# Patient Record
Sex: Male | Born: 1965 | Hispanic: No | Marital: Single | Smoking: Current every day smoker
Health system: Southern US, Community
[De-identification: ages and names within clinical notes are randomized; demographics above are authoritative.]

---

## 2019-03-28 ENCOUNTER — Encounter: Payer: Self-pay | Admitting: Internal Medicine

## 2019-05-02 ENCOUNTER — Ambulatory Visit: Payer: BC Managed Care – PPO | Admitting: Nurse Practitioner

## 2019-06-20 ENCOUNTER — Other Ambulatory Visit (HOSPITAL_COMMUNITY): Payer: Self-pay | Admitting: Family Medicine

## 2019-06-20 ENCOUNTER — Other Ambulatory Visit: Payer: Self-pay | Admitting: Family Medicine

## 2019-06-21 ENCOUNTER — Other Ambulatory Visit (HOSPITAL_COMMUNITY): Payer: Self-pay | Admitting: Family Medicine

## 2019-06-21 ENCOUNTER — Other Ambulatory Visit: Payer: Self-pay | Admitting: Family Medicine

## 2019-06-21 DIAGNOSIS — K439 Ventral hernia without obstruction or gangrene: Secondary | ICD-10-CM

## 2019-08-14 ENCOUNTER — Ambulatory Visit: Payer: BC Managed Care – PPO | Admitting: Gastroenterology

## 2019-09-21 ENCOUNTER — Ambulatory Visit
Admission: EM | Admit: 2019-09-21 | Discharge: 2019-09-21 | Disposition: A | Discharge status: Home / Self Care | Payer: BC Managed Care – PPO | Attending: Emergency Medicine | Admitting: Emergency Medicine

## 2019-09-21 ENCOUNTER — Other Ambulatory Visit: Payer: Self-pay

## 2019-09-21 DIAGNOSIS — K047 Periapical abscess without sinus: Secondary | ICD-10-CM | POA: Diagnosis not present

## 2019-09-21 DIAGNOSIS — K029 Dental caries, unspecified: Secondary | ICD-10-CM | POA: Diagnosis not present

## 2019-09-21 MED ORDER — AMOXICILLIN-POT CLAVULANATE 875-125 MG PO TABS: 1.0000 | ORAL_TABLET | Freq: Two times a day (BID) | ORAL | 0 refills | Status: DC

## 2019-09-21 MED ORDER — IBUPROFEN 800 MG PO TABS: 800.0000 mg | ORAL_TABLET | Freq: Three times a day (TID) | ORAL | 0 refills | Status: DC

## 2019-09-21 NOTE — ED Provider Notes (Signed)
[facility]    CSN: [id number] Arrival date & time: 09/21/19  0907      History   Chief Complaint Chief Complaint  Patient presents with  . Dental Pain    HPI Kerry Schroeder is a 54 y.o. male.   Kerry Schroeder is a 54 y.o. male who presents with dental pain for the past 1 week.  Denies a precipitating event or trauma.  Localizes pain to left lower first molar.  Has tried OTC analgesics without relief.  Worse with chewing.  Does not see a dentist regularly.  Reports  similar symptoms in the past.  Denies fever, chills, dysphagia, odynophagia, oral or neck swelling, nausea, vomiting, chest pain, SOB.        The history is provided by the patient. A language interpreter was used.  Dental Pain   History reviewed. No pertinent past medical history.  There are no problems to display for this patient.   History reviewed. No pertinent surgical history.     Home Medications    Prior to Admission medications   Medication Sig Start Date End Date Taking? Authorizing Provider  amoxicillin-clavulanate (AUGMENTIN) 875-125 MG tablet Take 1 tablet by mouth every 12 (twelve) hours. 09/21/19   Cruz Bong, Zachery Dakins, FNP  ibuprofen (ADVIL) 800 MG tablet Take 1 tablet (800 mg total) by mouth 3 (three) times daily. 09/21/19   AvegnoZachery Dakins, FNP    Family History Family History  Problem Relation Age of Onset  . Healthy Mother   . Healthy Father     Social History Social History   Tobacco Use  . Smoking status: Current Every Day Smoker    Packs/day: 0.50  . Smokeless tobacco: Never Used  Substance Use Topics  . Alcohol use: Not Currently  . Drug use: Not on file     Allergies   Patient has no known allergies.   Review of Systems Review of Systems  Constitutional: Negative.   HENT: Positive for dental problem.   Respiratory: Negative.   Cardiovascular: Negative.   Gastrointestinal: Negative.      Physical Exam Triage Vital Signs ED Triage Vitals    Enc Vitals Group     BP      Pulse      Resp      Temp      Temp src      SpO2      Weight      Height      Head Circumference      Peak Flow      Pain Score      Pain Loc      Pain Edu?      Excl. in GC?    No data found.  Updated Vital Signs BP 140/87 (BP Location: Right Arm)   Pulse 90   Temp (!) 97.4 F (36.3 C) (Oral)   Resp 18   SpO2 95%   Visual Acuity Right Eye Distance:   Left Eye Distance:   Bilateral Distance:    Right Eye Near:   Left Eye Near:    Bilateral Near:     Physical Exam Constitutional:      General: He is not in acute distress.    Appearance: Normal appearance. He is normal weight. He is not ill-appearing or toxic-appearing.  HENT:     Mouth/Throat:     Lips: Pink. No lesions.     Mouth: Mucous membranes are moist. No injury or oral lesions.  Dentition: Dental tenderness, dental caries and dental abscesses present. No gingival swelling.   Cardiovascular:     Rate and Rhythm: Normal rate and regular rhythm.     Pulses: Normal pulses.     Heart sounds: Normal heart sounds.  Pulmonary:     Effort: No respiratory distress.     Breath sounds: Normal breath sounds. No wheezing.  Chest:     Chest wall: No tenderness.  Neurological:     Mental Status: He is alert.      UC Treatments / Results  Labs (all labs ordered are listed, but only abnormal results are displayed) Labs Reviewed - No data to display  EKG   Radiology No results found.  Procedures Procedures (including critical care time)  Medications Ordered in UC Medications - No data to display  Initial Impression / Assessment and Plan / UC Course  I have reviewed the triage vital signs and the nursing notes.  Pertinent labs & imaging results that were available during my care of the patient were reviewed by me and considered in my medical decision making (see chart for details).   Patient stable for discharge.  Advised patient to take antibiotic as prescribed  and to completion.  Take ibuprofen as needed for pain.  To follow-up with your dentist as soon as possible.  Patient verbalized understanding of the plan of care.    Final Clinical Impressions(s) / UC Diagnoses   Final diagnoses:  Dental caries  Dental abscess     Discharge Instructions     Ibuprofen and Augmentin were prescribed prescribed.  Use as directed  Recommend soft diet until evaluated by dentist Maintain oral hygiene care Follow up with dentist as soon as possible for further evaluation and treatment  Return or go to the ED if you have any new or worsening symptoms such as fever, chills, difficulty swallowing, painful swallowing, oral or neck swelling, nausea, vomiting, chest pain, SOB, etc...     ED Prescriptions    Medication Sig Dispense Auth. Provider   amoxicillin-clavulanate (AUGMENTIN) 875-125 MG tablet Take 1 tablet by mouth every 12 (twelve) hours. 14 tablet Alanzo Lamb S, FNP   ibuprofen (ADVIL) 800 MG tablet Take 1 tablet (800 mg total) by mouth 3 (three) times daily. 21 tablet Lygia Olaes, Darrelyn Hillock, FNP     PDMP not reviewed this encounter.   Emerson Monte, [place] 09/21/19 [phone]

## 2019-09-21 NOTE — ED Triage Notes (Signed)
Pt presents to UC w/ c/o left lower dental pain x1 week. Pt states he has hole in tooth and the pain gets better when he drinks water, but the pain is coming back more frequently.

## 2019-09-21 NOTE — Discharge Instructions (Addendum)
Ibuprofen and Augmentin were prescribed prescribed.  Use as directed  Recommend soft diet until evaluated by dentist Maintain oral hygiene care Follow up with dentist as soon as possible for further evaluation and treatment  Return or go to the ED if you have any new or worsening symptoms such as fever, chills, difficulty swallowing, painful swallowing, oral or neck swelling, nausea, vomiting, chest pain, SOB, etc..Marland Kitchen

## 2020-03-04 ENCOUNTER — Other Ambulatory Visit: Payer: Self-pay | Admitting: General Surgery

## 2020-03-04 DIAGNOSIS — K439 Ventral hernia without obstruction or gangrene: Secondary | ICD-10-CM

## 2020-03-19 ENCOUNTER — Ambulatory Visit
Admission: RE | Admit: 2020-03-19 | Discharge: 2020-03-19 | Disposition: A | Discharge status: Home / Self Care | Payer: BC Managed Care – PPO | Source: Ambulatory Visit | Attending: General Surgery | Admitting: General Surgery

## 2020-03-19 DIAGNOSIS — K439 Ventral hernia without obstruction or gangrene: Secondary | ICD-10-CM

## 2020-03-19 MED ORDER — IOPAMIDOL (ISOVUE-300) INJECTION 61%
100.0000 mL | Freq: Once | INTRAVENOUS | Status: AC | PRN
  Administered 2020-03-19: 125 mL via INTRAVENOUS

## 2020-04-02 ENCOUNTER — Ambulatory Visit: Payer: Self-pay | Admitting: General Surgery

## 2020-04-16 NOTE — Progress Notes (Addendum)
Your procedure is scheduled on Tuesday August 3.  Report to [facility] Main Entrance "A" at 11:30 A.M., and check in at the Admitting office.  Call this number if you have problems the morning of surgery: [phone]  Call [phone] if you have any questions prior to your surgery date Monday-Friday 8am-4pm   Remember: Do not eat after midnight the night before your surgery  You may drink clear liquids until 10:30 A.M the morning of your surgery.   Clear liquids allowed are: Water, Non-Citrus Juices (without pulp), Carbonated Beverages, Clear Tea, Black Coffee Only, and Gatorade  Please complete your PRE-SURGERY ENSURE that was provided to you by 10:30 A.M the morning of your surgery.  Please, if able, drink it in one setting. DO NOT SIP.    Take these medicines the morning of surgery with A SIP OF WATER: NONE   As of today, STOP taking any Aspirin (unless otherwise instructed by your surgeon), Aleve, Naproxen, Ibuprofen, Motrin, Advil, Goody's, BC's, all herbal medications, fish oil, and all vitamins.    The Morning of Surgery  Do not wear jewelry.  Do not wear lotions, powders, colognes, or deodorant  Men may shave face and neck.  Do not bring valuables to the hospital.  [facility] is not responsible for any belongings or valuables.  If you are a smoker, DO NOT Smoke 24 hours prior to surgery  If you wear a CPAP at night please bring your mask the morning of surgery   Remember that you must have someone to transport you home after your surgery, and remain with you for 24 hours if you are discharged the same day.   Please bring cases for contacts, glasses, hearing aids, dentures or bridgework because it cannot be worn into surgery.    Leave your suitcase in the car.  After surgery it may be brought to your room.  For patients admitted to the hospital, discharge time will be determined by your treatment team.  Patients discharged the day of surgery will not be  allowed to drive home.    Special instructions:   [facility]- Preparing For Surgery  Before surgery, you can play an important role. Because skin is not sterile, your skin needs to be as free of germs as possible. You can reduce the number of germs on your skin by washing with CHG (chlorahexidine gluconate) Soap before surgery.  CHG is an antiseptic cleaner which kills germs and bonds with the skin to continue killing germs even after washing.    Oral Hygiene is also important to reduce your risk of infection.  Remember - BRUSH YOUR TEETH THE MORNING OF SURGERY WITH YOUR REGULAR TOOTHPASTE  Please do not use if you have an allergy to CHG or antibacterial soaps. If your skin becomes reddened/irritated stop using the CHG.  Do not shave (including legs and underarms) for at least 48 hours prior to first CHG shower. It is OK to shave your face.  Please follow these instructions carefully.   1. Shower the NIGHT BEFORE SURGERY and the MORNING OF SURGERY with CHG Soap.   2. If you chose to wash your hair and body, wash as usual with your normal shampoo and body-wash/soap.  3. Rinse your hair and body thoroughly to remove the shampoo and soap.  4. Apply CHG directly to the skin (ONLY FROM THE NECK DOWN) and wash gently with a scrungie or a clean washcloth.   5. Do not use on open wounds or open  sores. Avoid contact with your eyes, ears, mouth and genitals (private parts). Wash Face and genitals (private parts)  with your normal soap.   6. Wash thoroughly, paying special attention to the area where your surgery will be performed.  7. Thoroughly rinse your body with warm water from the neck down.  8. DO NOT shower/wash with your normal soap after using and rinsing off the CHG Soap.  9. Pat yourself dry with a CLEAN TOWEL.  10. Wear CLEAN PAJAMAS to bed the night before surgery  11. Place CLEAN SHEETS on your bed the night of your first shower and DO NOT SLEEP WITH PETS.  12. Wear  comfortable clothes the morning of surgery.     Day of Surgery:  Please shower the morning of surgery with the CHG soap Do not apply any deodorants/lotions. Please wear clean clothes to the hospital/surgery center.   Remember to brush your teeth WITH YOUR REGULAR TOOTHPASTE.   Please read over the following fact sheets that you were given.

## 2020-04-17 ENCOUNTER — Other Ambulatory Visit: Payer: Self-pay

## 2020-04-17 ENCOUNTER — Encounter (HOSPITAL_COMMUNITY): Payer: Self-pay

## 2020-04-17 ENCOUNTER — Encounter (HOSPITAL_COMMUNITY)
Admission: RE | Admit: 2020-04-17 | Discharge: 2020-04-17 | Disposition: A | Discharge status: Home / Self Care | Payer: BC Managed Care – PPO | Source: Ambulatory Visit | Attending: General Surgery | Admitting: General Surgery

## 2020-04-17 ENCOUNTER — Other Ambulatory Visit (HOSPITAL_COMMUNITY)
Admission: RE | Admit: 2020-04-17 | Discharge: 2020-04-17 | Disposition: A | Discharge status: Home / Self Care | Payer: BC Managed Care – PPO | Source: Ambulatory Visit | Attending: General Surgery | Admitting: General Surgery

## 2020-04-17 ENCOUNTER — Telehealth: Payer: Self-pay

## 2020-04-17 DIAGNOSIS — Z20822 Contact with and (suspected) exposure to covid-19: Secondary | ICD-10-CM | POA: Insufficient documentation

## 2020-04-17 DIAGNOSIS — Z01812 Encounter for preprocedural laboratory examination: Secondary | ICD-10-CM | POA: Diagnosis present

## 2020-04-17 LAB — HEMOGLOBIN: Hemoglobin: 15 g/dL (ref 13.0–17.0)

## 2020-04-17 LAB — SARS CORONAVIRUS 2 (TAT 6-24 HRS): SARS Coronavirus 2: NEGATIVE

## 2020-04-17 NOTE — Telephone Encounter (Signed)
Pt. Tested today for COVID 19 prior to procedure 04/21/20. Asking quarantine questions. Instructed to quarantine while waiting for results. States he and his sister have been vaccinated for COVID 19. Asking if she can visit him. Instructed that is fine and wear masks.

## 2020-04-17 NOTE — Progress Notes (Signed)
PCP - Lenise Herald, PA-C Cardiologist - pt denies   Chest x-ray - n/a EKG - n/a Stress Test - pt denies ECHO - pt denies Cardiac Cath - pt denies   Blood Thinner Instructions:n/a Aspirin Instructions:n/a  ERAS Protcol - yes PRE-SURGERY Ensure or G2- ensure  COVID TEST- 04/17/20  Coronavirus Screening  Have you experienced the following symptoms:  Cough yes/no: No Fever (>100.53F)  yes/no: No Runny nose yes/no: No Sore throat yes/no: No Difficulty breathing/shortness of breath  yes/no: No  Have you or a family member traveled in the last 14 days and where? yes/no: No   If the patient indicates "YES" to the above questions, their PAT will be rescheduled to limit the exposure to others and, the surgeon will be notified. THE PATIENT WILL NEED TO BE ASYMPTOMATIC FOR 14 DAYS.   If the patient is not experiencing any of these symptoms, the PAT nurse will instruct them to NOT bring anyone with them to their appointment since they may have these symptoms or traveled as well.   Please remind your patients and families that hospital visitation restrictions are in effect and the importance of the restrictions.     Anesthesia review: n/a  Patient denies shortness of breath, fever, cough and chest pain at PAT appointment   All instructions explained to the patient, with a verbal understanding of the material. Patient agrees to go over the instructions while at home for a better understanding. Patient also instructed to self quarantine after being tested for COVID-19. The opportunity to ask questions was provided.

## 2020-04-20 MED ORDER — DEXTROSE 5 % IV SOLN
3.0000 g | INTRAVENOUS | Status: AC
  Administered 2020-04-21: 2 g via INTRAVENOUS
  Filled 2020-04-20: qty 3

## 2020-04-21 ENCOUNTER — Ambulatory Visit (HOSPITAL_COMMUNITY): Payer: BC Managed Care – PPO

## 2020-04-21 ENCOUNTER — Encounter (HOSPITAL_COMMUNITY)
Admission: RE | Disposition: A | Discharge status: Home / Self Care | Payer: Self-pay | Source: Ambulatory Visit | Attending: General Surgery

## 2020-04-21 ENCOUNTER — Observation Stay (HOSPITAL_COMMUNITY)
Admission: RE | Admit: 2020-04-21 | Discharge: 2020-04-22 | Disposition: A | Discharge status: Home / Self Care | Payer: BC Managed Care – PPO | Source: Ambulatory Visit | Attending: General Surgery | Admitting: General Surgery

## 2020-04-21 ENCOUNTER — Other Ambulatory Visit: Payer: Self-pay

## 2020-04-21 ENCOUNTER — Encounter (HOSPITAL_COMMUNITY): Payer: Self-pay | Admitting: General Surgery

## 2020-04-21 DIAGNOSIS — K439 Ventral hernia without obstruction or gangrene: Secondary | ICD-10-CM | POA: Diagnosis present

## 2020-04-21 DIAGNOSIS — Z9889 Other specified postprocedural states: Secondary | ICD-10-CM

## 2020-04-21 DIAGNOSIS — F17210 Nicotine dependence, cigarettes, uncomplicated: Secondary | ICD-10-CM | POA: Diagnosis not present

## 2020-04-21 DIAGNOSIS — Z8719 Personal history of other diseases of the digestive system: Secondary | ICD-10-CM

## 2020-04-21 HISTORY — PX: INSERTION OF MESH: SHX5868

## 2020-04-21 HISTORY — PX: VENTRAL HERNIA REPAIR: SHX424

## 2020-04-21 LAB — CBC
HCT: 44.3 % (ref 39.0–52.0)
Hemoglobin: 14.5 g/dL (ref 13.0–17.0)
MCH: 29.1 pg (ref 26.0–34.0)
MCHC: 32.7 g/dL (ref 30.0–36.0)
MCV: 88.8 fL (ref 80.0–100.0)
Platelets: 215 10*3/uL (ref 150–400)
RBC: 4.99 MIL/uL (ref 4.22–5.81)
RDW: 13.6 % (ref 11.5–15.5)
WBC: 7.9 10*3/uL (ref 4.0–10.5)
nRBC: 0 % (ref 0.0–0.2)

## 2020-04-21 SURGERY — REPAIR, HERNIA, VENTRAL, LAPAROSCOPIC
Anesthesia: General | Site: Abdomen

## 2020-04-21 MED ORDER — 0.9 % SODIUM CHLORIDE (POUR BTL) OPTIME
TOPICAL | Status: DC | PRN
Start: 2020-04-21 — End: 2020-04-21
  Administered 2020-04-21: 1000 mL

## 2020-04-21 MED ORDER — ENSURE PRE-SURGERY PO LIQD: 296.0000 mL | Freq: Once | ORAL | Status: DC

## 2020-04-21 MED ORDER — ORAL CARE MOUTH RINSE: 15.0000 mL | Freq: Once | OROMUCOSAL | Status: DC

## 2020-04-21 MED ORDER — BUPIVACAINE-EPINEPHRINE 0.5% -1:200000 IJ SOLN
INTRAMUSCULAR | Status: DC | PRN
  Administered 2020-04-21: 21 mL

## 2020-04-21 MED ORDER — ROCURONIUM BROMIDE 100 MG/10ML IV SOLN
INTRAVENOUS | Status: DC | PRN
  Administered 2020-04-21 (×2): 20 mg via INTRAVENOUS
  Administered 2020-04-21: 60 mg via INTRAVENOUS

## 2020-04-21 MED ORDER — ONDANSETRON HCL 4 MG/2ML IJ SOLN
INTRAMUSCULAR | Status: DC | PRN
Start: 2020-04-21 — End: 2020-04-21
  Administered 2020-04-21: 4 mg via INTRAVENOUS

## 2020-04-21 MED ORDER — ACETAMINOPHEN 500 MG PO TABS
ORAL_TABLET | ORAL | Status: AC
  Administered 2020-04-21: 1000 mg via ORAL
  Filled 2020-04-21: qty 2

## 2020-04-21 MED ORDER — DIPHENHYDRAMINE HCL 25 MG PO CAPS: 25.0000 mg | ORAL_CAPSULE | Freq: Four times a day (QID) | ORAL | Status: DC | PRN

## 2020-04-21 MED ORDER — ONDANSETRON HCL 4 MG/2ML IJ SOLN: 4.0000 mg | Freq: Once | INTRAMUSCULAR | Status: DC | PRN

## 2020-04-21 MED ORDER — PHENYLEPHRINE HCL (PRESSORS) 10 MG/ML IV SOLN
INTRAVENOUS | Status: DC | PRN
Start: 2020-04-21 — End: 2020-04-21
  Administered 2020-04-21 (×9): 80 ug via INTRAVENOUS

## 2020-04-21 MED ORDER — ROCURONIUM BROMIDE 10 MG/ML (PF) SYRINGE
PREFILLED_SYRINGE | INTRAVENOUS | Status: AC
  Filled 2020-04-21: qty 10

## 2020-04-21 MED ORDER — ACETAMINOPHEN 500 MG PO TABS: 1000.0000 mg | ORAL_TABLET | ORAL | Status: AC

## 2020-04-21 MED ORDER — ONDANSETRON HCL 4 MG/2ML IJ SOLN
INTRAMUSCULAR | Status: AC
  Filled 2020-04-21: qty 2

## 2020-04-21 MED ORDER — CEFAZOLIN SODIUM-DEXTROSE 2-4 GM/100ML-% IV SOLN
INTRAVENOUS | Status: AC
  Filled 2020-04-21: qty 100

## 2020-04-21 MED ORDER — FENTANYL CITRATE (PF) 100 MCG/2ML IJ SOLN
25.0000 ug | INTRAMUSCULAR | Status: DC | PRN
  Administered 2020-04-21: 50 ug via INTRAVENOUS

## 2020-04-21 MED ORDER — FENTANYL CITRATE (PF) 250 MCG/5ML IJ SOLN
INTRAMUSCULAR | Status: AC
  Filled 2020-04-21: qty 5

## 2020-04-21 MED ORDER — SUGAMMADEX SODIUM 200 MG/2ML IV SOLN
INTRAVENOUS | Status: DC | PRN
Start: 2020-04-21 — End: 2020-04-21
  Administered 2020-04-21: 300 mg via INTRAVENOUS

## 2020-04-21 MED ORDER — MIDAZOLAM HCL 2 MG/2ML IJ SOLN
INTRAMUSCULAR | Status: AC
  Filled 2020-04-21: qty 2

## 2020-04-21 MED ORDER — MIDAZOLAM HCL 5 MG/5ML IJ SOLN
INTRAMUSCULAR | Status: DC | PRN
  Administered 2020-04-21: 2 mg via INTRAVENOUS

## 2020-04-21 MED ORDER — SIMETHICONE 80 MG PO CHEW: 40.0000 mg | CHEWABLE_TABLET | Freq: Four times a day (QID) | ORAL | Status: DC | PRN

## 2020-04-21 MED ORDER — DEXAMETHASONE SODIUM PHOSPHATE 10 MG/ML IJ SOLN
INTRAMUSCULAR | Status: AC
  Filled 2020-04-21: qty 1

## 2020-04-21 MED ORDER — METOPROLOL TARTRATE 5 MG/5ML IV SOLN: 5.0000 mg | Freq: Four times a day (QID) | INTRAVENOUS | Status: DC | PRN

## 2020-04-21 MED ORDER — LIDOCAINE 2% (20 MG/ML) 5 ML SYRINGE
INTRAMUSCULAR | Status: DC | PRN
  Administered 2020-04-21: 40 mg via INTRAVENOUS

## 2020-04-21 MED ORDER — CELECOXIB 200 MG PO CAPS
200.0000 mg | ORAL_CAPSULE | Freq: Two times a day (BID) | ORAL | Status: DC
  Administered 2020-04-21 – 2020-04-22 (×2): 200 mg via ORAL
  Filled 2020-04-21 (×3): qty 1

## 2020-04-21 MED ORDER — CHLORHEXIDINE GLUCONATE 0.12 % MT SOLN: 15.0000 mL | Freq: Once | OROMUCOSAL | Status: DC

## 2020-04-21 MED ORDER — DIPHENHYDRAMINE HCL 50 MG/ML IJ SOLN: 25.0000 mg | Freq: Four times a day (QID) | INTRAMUSCULAR | Status: DC | PRN

## 2020-04-21 MED ORDER — SUGAMMADEX SODIUM 500 MG/5ML IV SOLN
INTRAVENOUS | Status: AC
  Filled 2020-04-21: qty 10

## 2020-04-21 MED ORDER — OXYCODONE HCL 5 MG PO TABS: 5.0000 mg | ORAL_TABLET | Freq: Once | ORAL | Status: AC | PRN

## 2020-04-21 MED ORDER — ENOXAPARIN SODIUM 40 MG/0.4ML ~~LOC~~ SOLN
40.0000 mg | SUBCUTANEOUS | Status: DC
  Administered 2020-04-22: 40 mg via SUBCUTANEOUS
  Filled 2020-04-21: qty 0.4

## 2020-04-21 MED ORDER — METHOCARBAMOL 500 MG PO TABS: 500.0000 mg | ORAL_TABLET | Freq: Four times a day (QID) | ORAL | Status: DC | PRN

## 2020-04-21 MED ORDER — CHLORHEXIDINE GLUCONATE CLOTH 2 % EX PADS: 6.0000 | MEDICATED_PAD | Freq: Once | CUTANEOUS | Status: DC

## 2020-04-21 MED ORDER — BUPIVACAINE-EPINEPHRINE 0.5% -1:200000 IJ SOLN
INTRAMUSCULAR | Status: AC
  Filled 2020-04-21: qty 1

## 2020-04-21 MED ORDER — ZOLPIDEM TARTRATE 5 MG PO TABS: 5.0000 mg | ORAL_TABLET | Freq: Every evening | ORAL | Status: DC | PRN

## 2020-04-21 MED ORDER — HYDROMORPHONE HCL 1 MG/ML IJ SOLN: 1.0000 mg | INTRAMUSCULAR | Status: DC | PRN

## 2020-04-21 MED ORDER — OXYCODONE HCL 5 MG PO TABS: 5.0000 mg | ORAL_TABLET | ORAL | Status: DC | PRN

## 2020-04-21 MED ORDER — PROPOFOL 10 MG/ML IV BOLUS
INTRAVENOUS | Status: AC
  Filled 2020-04-21: qty 20

## 2020-04-21 MED ORDER — OXYCODONE HCL 5 MG PO TABS
ORAL_TABLET | ORAL | Status: AC
  Administered 2020-04-21: 5 mg via ORAL
  Filled 2020-04-21: qty 1

## 2020-04-21 MED ORDER — POTASSIUM CHLORIDE IN NACL 20-0.9 MEQ/L-% IV SOLN
INTRAVENOUS | Status: DC
  Filled 2020-04-21 (×2): qty 1000

## 2020-04-21 MED ORDER — GABAPENTIN 300 MG PO CAPS
300.0000 mg | ORAL_CAPSULE | Freq: Two times a day (BID) | ORAL | Status: DC
  Administered 2020-04-21 – 2020-04-22 (×2): 300 mg via ORAL
  Filled 2020-04-21 (×2): qty 1

## 2020-04-21 MED ORDER — LACTATED RINGERS IV SOLN: INTRAVENOUS | Status: DC

## 2020-04-21 MED ORDER — CELECOXIB 200 MG PO CAPS
ORAL_CAPSULE | ORAL | Status: AC
  Administered 2020-04-21: 400 mg via ORAL
  Filled 2020-04-21: qty 2

## 2020-04-21 MED ORDER — EPHEDRINE SULFATE 50 MG/ML IJ SOLN
INTRAMUSCULAR | Status: DC | PRN
  Administered 2020-04-21 (×4): 5 mg via INTRAVENOUS

## 2020-04-21 MED ORDER — OXYCODONE HCL 5 MG/5ML PO SOLN: 5.0000 mg | Freq: Once | ORAL | Status: AC | PRN

## 2020-04-21 MED ORDER — PROPOFOL 10 MG/ML IV BOLUS
INTRAVENOUS | Status: DC | PRN
  Administered 2020-04-21: 200 mg via INTRAVENOUS

## 2020-04-21 MED ORDER — ONDANSETRON HCL 4 MG/2ML IJ SOLN: 4.0000 mg | Freq: Four times a day (QID) | INTRAMUSCULAR | Status: DC | PRN

## 2020-04-21 MED ORDER — LIDOCAINE 2% (20 MG/ML) 5 ML SYRINGE
INTRAMUSCULAR | Status: AC
  Filled 2020-04-21: qty 5

## 2020-04-21 MED ORDER — CELECOXIB 200 MG PO CAPS: 400.0000 mg | ORAL_CAPSULE | ORAL | Status: AC

## 2020-04-21 MED ORDER — FENTANYL CITRATE (PF) 250 MCG/5ML IJ SOLN
INTRAMUSCULAR | Status: DC | PRN
  Administered 2020-04-21 (×2): 50 ug via INTRAVENOUS

## 2020-04-21 MED ORDER — ONDANSETRON 4 MG PO TBDP: 4.0000 mg | ORAL_TABLET | Freq: Four times a day (QID) | ORAL | Status: DC | PRN

## 2020-04-21 MED ORDER — FENTANYL CITRATE (PF) 100 MCG/2ML IJ SOLN
INTRAMUSCULAR | Status: AC
  Administered 2020-04-21: 50 ug via INTRAVENOUS
  Filled 2020-04-21: qty 2

## 2020-04-21 MED ORDER — DEXAMETHASONE SODIUM PHOSPHATE 10 MG/ML IJ SOLN
INTRAMUSCULAR | Status: DC | PRN
Start: 2020-04-21 — End: 2020-04-21
  Administered 2020-04-21: 10 mg via INTRAVENOUS

## 2020-04-21 MED ORDER — SODIUM CHLORIDE 0.9 % IR SOLN
Status: DC | PRN
  Administered 2020-04-21: 1000 mL

## 2020-04-21 MED ORDER — PHENYLEPHRINE 40 MCG/ML (10ML) SYRINGE FOR IV PUSH (FOR BLOOD PRESSURE SUPPORT)
PREFILLED_SYRINGE | INTRAVENOUS | Status: AC
  Filled 2020-04-21: qty 20

## 2020-04-21 MED ORDER — GABAPENTIN 300 MG PO CAPS
300.0000 mg | ORAL_CAPSULE | ORAL | Status: AC
  Administered 2020-04-21: 300 mg via ORAL
  Filled 2020-04-21: qty 1

## 2020-04-21 MED ORDER — CHLORHEXIDINE GLUCONATE 0.12 % MT SOLN
OROMUCOSAL | Status: AC
  Filled 2020-04-21: qty 15

## 2020-04-21 SURGICAL SUPPLY — 59 items
APPLIER CLIP 5 13 M/L LIGAMAX5 (MISCELLANEOUS)
APPLIER CLIP ROT 10 11.4 M/L (STAPLE)
BINDER ABDOMINAL 12 XL 75-84 (SOFTGOODS) ×3 IMPLANT
BIOPATCH RED 1 DISK 7.0 (GAUZE/BANDAGES/DRESSINGS) ×2 IMPLANT
BIOPATCH RED 1IN DISK 7.0MM (GAUZE/BANDAGES/DRESSINGS) ×1
BLADE CLIPPER SURG (BLADE) IMPLANT
CANISTER SUCT 3000ML PPV (MISCELLANEOUS) ×3 IMPLANT
CHLORAPREP W/TINT 26 (MISCELLANEOUS) ×3 IMPLANT
CLIP APPLIE 5 13 M/L LIGAMAX5 (MISCELLANEOUS) IMPLANT
CLIP APPLIE ROT 10 11.4 M/L (STAPLE) IMPLANT
COVER SURGICAL LIGHT HANDLE (MISCELLANEOUS) ×3 IMPLANT
COVER WAND RF STERILE (DRAPES) ×3 IMPLANT
DERMABOND ADVANCED (GAUZE/BANDAGES/DRESSINGS) ×2
DERMABOND ADVANCED .7 DNX12 (GAUZE/BANDAGES/DRESSINGS) ×1 IMPLANT
DEVICE SECURE STRAP 25 ABSORB (INSTRUMENTS) ×6 IMPLANT
DEVICE TROCAR PUNCTURE CLOSURE (ENDOMECHANICALS) ×3 IMPLANT
DRAIN CHANNEL 19F RND (DRAIN) ×3 IMPLANT
DRAPE LAPAROSCOPIC ABDOMINAL (DRAPES) ×3 IMPLANT
DRSG TEGADERM 4X4.75 (GAUZE/BANDAGES/DRESSINGS) ×3 IMPLANT
ELECT REM PT RETURN 9FT ADLT (ELECTROSURGICAL) ×3
ELECTRODE REM PT RTRN 9FT ADLT (ELECTROSURGICAL) ×1 IMPLANT
EVACUATOR SILICONE 100CC (DRAIN) ×3 IMPLANT
FILTER SMOKE EVAC LAPAROSHD (FILTER) IMPLANT
GLOVE BIO SURGEON STRL SZ8 (GLOVE) ×3 IMPLANT
GLOVE BIOGEL PI IND STRL 8 (GLOVE) ×1 IMPLANT
GLOVE BIOGEL PI INDICATOR 8 (GLOVE) ×2
GLOVE SS BIOGEL STRL SZ 6.5 (GLOVE) ×1 IMPLANT
GLOVE SUPERSENSE BIOGEL SZ 6.5 (GLOVE) ×2
GOWN STRL REUS W/ TWL LRG LVL3 (GOWN DISPOSABLE) ×2 IMPLANT
GOWN STRL REUS W/ TWL XL LVL3 (GOWN DISPOSABLE) ×1 IMPLANT
GOWN STRL REUS W/TWL LRG LVL3 (GOWN DISPOSABLE) ×4
GOWN STRL REUS W/TWL XL LVL3 (GOWN DISPOSABLE) ×2
GRASPER SUT TROCAR 14GX15 (MISCELLANEOUS) ×3 IMPLANT
KIT BASIN OR (CUSTOM PROCEDURE TRAY) ×3 IMPLANT
KIT TURNOVER KIT B (KITS) ×3 IMPLANT
MARKER SKIN DUAL TIP RULER LAB (MISCELLANEOUS) ×3 IMPLANT
MESH VENTRALIGHT ST 4.5IN (Mesh General) ×3 IMPLANT
NEEDLE 22X1 1/2 (OR ONLY) (NEEDLE) ×3 IMPLANT
NEEDLE SPNL 22GX3.5 QUINCKE BK (NEEDLE) ×3 IMPLANT
NS IRRIG 1000ML POUR BTL (IV SOLUTION) ×3 IMPLANT
PAD ARMBOARD 7.5X6 YLW CONV (MISCELLANEOUS) ×6 IMPLANT
SCISSORS LAP 5X35 DISP (ENDOMECHANICALS) ×3 IMPLANT
SET IRRIG TUBING LAPAROSCOPIC (IRRIGATION / IRRIGATOR) ×3 IMPLANT
SET TUBE SMOKE EVAC HIGH FLOW (TUBING) ×3 IMPLANT
SHEARS HARMONIC ACE PLUS 36CM (ENDOMECHANICALS) ×3 IMPLANT
SLEEVE ENDOPATH XCEL 5M (ENDOMECHANICALS) ×3 IMPLANT
SUT ETHILON 2 0 FS 18 (SUTURE) ×3 IMPLANT
SUT PROLENE 0 CT 1 CR/8 (SUTURE) ×3 IMPLANT
SUT VIC AB 4-0 PS2 27 (SUTURE) ×3 IMPLANT
SUT VICRYL 0 TIES 12 18 (SUTURE) IMPLANT
TOWEL GREEN STERILE (TOWEL DISPOSABLE) ×3 IMPLANT
TOWEL GREEN STERILE FF (TOWEL DISPOSABLE) ×3 IMPLANT
TRAY FOLEY W/BAG SLVR 16FR (SET/KITS/TRAYS/PACK) ×2
TRAY FOLEY W/BAG SLVR 16FR ST (SET/KITS/TRAYS/PACK) ×1 IMPLANT
TRAY LAPAROSCOPIC MC (CUSTOM PROCEDURE TRAY) ×3 IMPLANT
TROCAR XCEL BLUNT TIP 100MML (ENDOMECHANICALS) IMPLANT
TROCAR XCEL NON-BLD 11X100MML (ENDOMECHANICALS) ×3 IMPLANT
TROCAR XCEL NON-BLD 5MMX100MML (ENDOMECHANICALS) ×3 IMPLANT
WATER STERILE IRR 1000ML POUR (IV SOLUTION) ×3 IMPLANT

## 2020-04-21 NOTE — Anesthesia Procedure Notes (Deleted)
Performed by: Nikala Walsworth S, CRNA       

## 2020-04-21 NOTE — Anesthesia Procedure Notes (Addendum)
Procedure Name: Intubation Date/Time: 04/21/2020 1:10 PM Performed by: Jed Limerick, CRNA Pre-anesthesia Checklist: Patient identified, Emergency Drugs available, Suction available and Patient being monitored Patient Re-evaluated:Patient Re-evaluated prior to induction Oxygen Delivery Method: Circle System Utilized Preoxygenation: Pre-oxygenation with 100% oxygen Induction Type: IV induction Ventilation: Mask ventilation without difficulty, Two handed mask ventilation required and Oral airway inserted - appropriate to patient size Laryngoscope Size: Glidescope and 4 Grade View: Grade I Tube type: Oral Tube size: 7.5 mm Number of attempts: 1 Airway Equipment and Method: Rigid stylet and Video-laryngoscopy Placement Confirmation: ETT inserted through vocal cords under direct vision,  positive ETCO2 and breath sounds checked- equal and bilateral Secured at: 22 cm Tube secured with: Tape Dental Injury: Teeth and Oropharynx as per pre-operative assessment

## 2020-04-21 NOTE — Interval H&P Note (Signed)
History and Physical Interval Note:  04/21/2020 12:24 PM  Kerry Schroeder  has presented today for surgery, with the diagnosis of VENTRAL HERNIA.  The various methods of treatment have been discussed with the patient and family. After consideration of risks, benefits and other options for treatment, the patient has consented to  Procedure(s): LAPAROSCOPIC REPAIR VENTRAL HERNIA WITH MESH (N/A) as a surgical intervention.  The patient's history has been reviewed, patient examined, no change in status, stable for surgery.  I have reviewed the patient's chart and labs.  Questions were answered to the patient's satisfaction.     Liz Malady

## 2020-04-21 NOTE — Anesthesia Preprocedure Evaluation (Signed)
Anesthesia Evaluation  Patient identified by MRN, date of birth, ID band Patient awake    Reviewed: Allergy & Precautions, NPO status , Patient's Chart, lab work & pertinent test results  Airway Mallampati: II  TM Distance: >3 FB Neck ROM: Full    Dental  (+) Teeth Intact, Dental Advisory Given   Pulmonary Current Smoker,    breath sounds clear to auscultation       Cardiovascular  Rhythm:Regular Rate:Normal     Neuro/Psych    GI/Hepatic   Endo/Other    Renal/GU      Musculoskeletal   Abdominal   Peds  Hematology   Anesthesia Other Findings   Reproductive/Obstetrics                             Anesthesia Physical Anesthesia Plan  ASA: II  Anesthesia Plan: General   Post-op Pain Management:    Induction: Intravenous  PONV Risk Score and Plan: Ondansetron and Dexamethasone  Airway Management Planned: Oral ETT  Additional Equipment:   Intra-op Plan:   Post-operative Plan: Extubation in OR  Informed Consent: I have reviewed the patients History and Physical, chart, labs and discussed the procedure including the risks, benefits and alternatives for the proposed anesthesia with the patient or authorized representative who has indicated his/her understanding and acceptance.     Dental advisory given  Plan Discussed with: CRNA and Anesthesiologist  Anesthesia Plan Comments:         Anesthesia Quick Evaluation  

## 2020-04-21 NOTE — Anesthesia Postprocedure Evaluation (Signed)
Anesthesia Post Note  Patient: Jassiel Flye  Procedure(s) Performed: LAPAROSCOPIC REPAIR VENTRAL HERNIA (N/A Abdomen) INSERTION OF MESH (N/A Abdomen)     Patient location during evaluation: PACU Anesthesia Type: General Level of consciousness: awake and alert Pain management: pain level controlled Vital Signs Assessment: post-procedure vital signs reviewed and stable Respiratory status: spontaneous breathing, nonlabored ventilation, respiratory function stable and patient connected to nasal cannula oxygen Cardiovascular status: blood pressure returned to baseline and stable Postop Assessment: no apparent nausea or vomiting Anesthetic complications: no   No complications documented.  Last Vitals:  Vitals:   04/21/20 1720 04/21/20 1750  BP: 130/74 130/74  Pulse: 97 94  Resp: (!) 23 17  Temp:    SpO2: 97% 97%    Last Pain:  Vitals:   04/21/20 1720  TempSrc:   PainSc: 0-No pain                 Shelton Silvas

## 2020-04-21 NOTE — H&P (Addendum)
Kerry Schroeder is an 54 y.o. male.   Chief Complaint: ventral hernia HPI: 54yo M presents for laparoscopic repair of ventral hernia with mesh. His symptoms have not changed since I saw him in the office.  History reviewed. No pertinent past medical history.  History reviewed. No pertinent surgical history.  Family History  Problem Relation Age of Onset   Healthy Mother    Healthy Father    Social History:  reports that he has been smoking. He has been smoking about 0.25 packs per day. He has never used smokeless tobacco. He reports previous alcohol use. He reports previous drug use.  Allergies: No Known Allergies  Medications Prior to Admission  Medication Sig Dispense Refill   amoxicillin-clavulanate (AUGMENTIN) 875-125 MG tablet Take 1 tablet by mouth every 12 (twelve) hours. (Patient not taking: Reported on 04/10/2020) 14 tablet 0   ibuprofen (ADVIL) 800 MG tablet Take 1 tablet (800 mg total) by mouth 3 (three) times daily. (Patient not taking: Reported on 04/10/2020) 21 tablet 0    No results found for this or any previous visit (from the past 48 hour(s)). No results found.  Review of Systems  Blood pressure (!) 133/43, pulse 85, temperature 98.4 F (36.9 C), temperature source Oral, resp. rate 20, height 5\' 7"  (1.702 m), weight 117.9 kg, SpO2 95 %. Physical Exam Constitutional:      Appearance: Normal appearance.  HENT:     Head: Normocephalic.     Nose: Nose normal.  Eyes:     Extraocular Movements: Extraocular movements intact.     Pupils: Pupils are equal, round, and reactive to light.  Cardiovascular:     Rate and Rhythm: Normal rate and regular rhythm.     Pulses: Normal pulses.     Heart sounds: Normal heart sounds.  Pulmonary:     Effort: Pulmonary effort is normal.     Breath sounds: Normal breath sounds.  Abdominal:     Palpations: Abdomen is soft.     Tenderness: There is no abdominal tenderness.     Hernia: A hernia is present.     Comments:  Upper midline ventral hernia reduces easily without discomfort, spontaneously recurs  Musculoskeletal:        General: Normal range of motion.     Cervical back: Neck supple. No tenderness.  Skin:    General: Skin is warm and dry.  Neurological:     Mental Status: He is alert and oriented to person, place, and time.  Psychiatric:        Mood and Affect: Mood normal.      Assessment/Plan Ventral hernia -for laparoscopic repair of ventral hernia with mesh. I again discussed the procedure, risks, and benefits in detail with him. I answered his questions. I also discussed the expected postoperative course. He is agreeable. Plan 23h obs.  , MD 04/21/2020, 12:22 PM

## 2020-04-21 NOTE — Transfer of Care (Signed)
Immediate Anesthesia Transfer of Care Note  Patient: Kerry Schroeder  Procedure(s) Performed: LAPAROSCOPIC REPAIR VENTRAL HERNIA (N/A Abdomen) INSERTION OF MESH (N/A Abdomen)  Patient Location: PACU  Anesthesia Type:General  Level of Consciousness: awake and patient cooperative  Airway & Oxygen Therapy: Patient Spontanous Breathing and Patient connected to face mask oxygen  Post-op Assessment: Report given to RN, Post -op Vital signs reviewed and stable and Patient moving all extremities  Post vital signs: Reviewed and stable  Last Vitals:  Vitals Value Taken Time  BP 125/73 04/21/20 1537  Temp    Pulse 46 04/21/20 1537  Resp 18 04/21/20 1537  SpO2 100 % 04/21/20 1537  Vitals shown include unvalidated device data.  Last Pain:  Vitals:   04/21/20 1204  TempSrc:   PainSc: 0-No pain         Complications: No complications documented.

## 2020-04-21 NOTE — Op Note (Signed)
04/21/2020  3:08 PM  PATIENT:  Kerry Schroeder  54 y.o. male  PRE-OPERATIVE DIAGNOSIS:  VENTRAL HERNIA  POST-OPERATIVE DIAGNOSIS:  VENTRAL HERNIA  PROCEDURE:  Procedure(s): LAPAROSCOPIC REPAIR VENTRAL HERNIA INSERTION OF MESH 11.4CM VENTRALIGHT  SURGEON:  Violeta Gelinas, MD  ASSISTANTS: Manus Rudd. MD   ANESTHESIA:   local and general  EBL:  Total I/O In: 1100 [I.V.:1000; IV Piggyback:100] Out: 10 [Blood:10]  BLOOD ADMINISTERED:none  DRAINS: (1) Jackson-Pratt drain(s) with closed bulb suction in the RUQ   SPECIMEN:  No Specimen  DISPOSITION OF SPECIMEN:  N/A  COUNTS:  YES  DICTATION: .Dragon Dictation Findings: Ventral hernia containing colon, fascial defect 4 cm  Procedure in detail: Informed consent was obtained. He received intravenous antibiotics. He was brought to the operating room and general endotracheal anesthesia was administered by the anesthesia staff. Foley catheter was placed by nursing. His abdomen was prepped and draped in sterile fashion. Timeout procedure was performed. I injected local along the left costal margin in the left upper quadrant then made a 5 mm incision. I used an Optiview camera technique to enter the abdomen. This went easily without difficulty. The abdomen was insufflated and the area was inspected, there were no injuries noted. The abdomen was explored. The hernia was seen clearly. I placed 3 other ports under direct vision to include a 12 mm left lower quadrant, 5 mm right upper quadrant, and 5 mm right lower quadrant. Local was used at each port site. We then began gradual dissection of the hernia were passed through the fascial defect. This was a bit tedious but we persisted carefully. I divided the falciform ligament which was at the superior portion of the defect using harmonic scalpel. We circumferentially dissected the hernia sac and reduce the hernia contents back into the abdomen. This contained a loop of transverse colon. There  were no injuries to the bowel. There is no significant bleeding. The remainder of the hernia sac was cleared off completely reducing all contents. The fascial defect was noted to be about 4 cm. Therefore I selected a 11.4 cm ventral light mesh to allow for centimeters of underlay circumferentially. I placed four 2-0 Prolene sutures along the outside of the mesh. There was a large space in the subcutaneous tissues from the chronic hernia. Due to this, I placed a 32 [place] Blake drain stab incision into the pocket of subcutaneous tissue. The drain was secured with nylon suture. I rolled up in the mesh with the sutures in place and passed into the abdomen. I grasped the 4 sets of sutures, each 1 separately through stab wounds using an Endo Catch device. These were left untied initially. Next I used the PMI and placed 2 interrupted 2-0 Prolene's percutaneously and tied these down to close the fascial defect under direct vision. The drain was kept in the subcutaneous pocket outside of the sutures. We then tied each of the sutures from the mesh and the mesh laid out fairly flat. I then used the secure strap to place 2 concentric rings of straps to further tack the mesh under direct vision. There was good hemostasis. The mesh laid out nicely. We reinspected the colon and previous hernia contents and there was no evidence of bleeding or other complications. Four-quadrant inspection revealed no other abnormalities. The pneumoperitoneum was released. Ports were removed under direct vision. The drain was placed to bulb suction. All wounds were irrigated. The port sites were closed with 4-0 Vicryl subcuticular suture from by Dermabond. The small stab  incisions were closed with Dermabond. An abdominal binder was placed. All counts were correct. He tolerated the procedure well without apparent complication and was taken recovery in stable condition. PATIENT DISPOSITION:  PACU - hemodynamically stable.   Delay start of  Pharmacological VTE agent (>24hrs) due to surgical blood loss or risk of bleeding:  no  Violeta Gelinas, MD, MPH, FACS Pager: [phone]  8/3/20213:08 PM

## 2020-04-22 ENCOUNTER — Encounter (HOSPITAL_COMMUNITY): Payer: Self-pay | Admitting: General Surgery

## 2020-04-22 DIAGNOSIS — K439 Ventral hernia without obstruction or gangrene: Secondary | ICD-10-CM | POA: Diagnosis not present

## 2020-04-22 LAB — BASIC METABOLIC PANEL
Anion gap: 13 (ref 5–15)
BUN: 20 mg/dL (ref 6–20)
CO2: 20 mmol/L — ABNORMAL LOW (ref 22–32)
Calcium: 9 mg/dL (ref 8.9–10.3)
Chloride: 105 mmol/L (ref 98–111)
Creatinine, Ser: 1.88 mg/dL — ABNORMAL HIGH (ref 0.61–1.24)
GFR calc Af Amer: 47 mL/min — ABNORMAL LOW (ref >60)
GFR calc non Af Amer: 40 mL/min — ABNORMAL LOW (ref >60)
Glucose, Bld: 179 mg/dL — ABNORMAL HIGH (ref 70–99)
Potassium: 4.5 mmol/L (ref 3.5–5.1)
Sodium: 138 mmol/L (ref 135–145)

## 2020-04-22 LAB — CBC
HCT: 41.6 % (ref 39.0–52.0)
Hemoglobin: 13.3 g/dL (ref 13.0–17.0)
MCH: 28.9 pg (ref 26.0–34.0)
MCHC: 32 g/dL (ref 30.0–36.0)
MCV: 90.2 fL (ref 80.0–100.0)
Platelets: 213 10*3/uL (ref 150–400)
RBC: 4.61 MIL/uL (ref 4.22–5.81)
RDW: 14.1 % (ref 11.5–15.5)
WBC: 13.6 10*3/uL — ABNORMAL HIGH (ref 4.0–10.5)
nRBC: 0 % (ref 0.0–0.2)

## 2020-04-22 MED ORDER — OXYCODONE HCL 5 MG PO TABS: 5.0000 mg | ORAL_TABLET | Freq: Four times a day (QID) | ORAL | 0 refills | Status: AC | PRN

## 2020-04-22 MED ORDER — OXYCODONE HCL 5 MG PO TABS: 5.0000 mg | ORAL_TABLET | Freq: Four times a day (QID) | ORAL | 0 refills | Status: DC | PRN

## 2020-04-22 MED ORDER — METHOCARBAMOL 500 MG PO TABS: 500.0000 mg | ORAL_TABLET | Freq: Four times a day (QID) | ORAL | 1 refills | Status: DC | PRN

## 2020-04-22 MED ORDER — METHOCARBAMOL 500 MG PO TABS: 500.0000 mg | ORAL_TABLET | Freq: Four times a day (QID) | ORAL | 1 refills | Status: AC | PRN

## 2020-04-22 NOTE — Discharge Summary (Signed)
Physician Discharge Summary  Patient ID: Kerry Schroeder MRN: [id number] DOB/AGE: August 16, 1966 54 y.o.  Admit date: 04/21/2020 Discharge date: 04/22/2020  Admission Diagnoses:ventral hernia  Discharge Diagnoses:  Active Problems:   S/P laparoscopic hernia repair   Discharged Condition: good  Hospital Course: Patient underwent laparoscopic repair of ventral hernia. He did well post-op. POD#1 he has good pain control and has tolerated a diet. Ready for D/C.  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 He will F/U with his primary MD.  Treatments: surgery: above  Discharge Exam: Blood pressure (!) 145/87, pulse 76, temperature 97.9 F (36.6 C), temperature source Oral, resp. rate 17, height 5\' 7"  (1.702 m), weight 117.9 kg, SpO2 97 %. General appearance: alert and cooperative Resp: clear to auscultation bilaterally Cardio: S1, S2 normal GI: soft, incisions CDI, drain SS Extremities: calves soft  Disposition: Discharge disposition: 01-Home or Self Care       Discharge Instructions    Call MD for:  redness, tenderness, or signs of infection (pain, swelling, redness, odor or green/yellow discharge around incision site)   Complete by: As directed    Diet - low sodium heart healthy   Complete by: As directed    Discharge instructions   Complete by: As directed    [facility] _______[facility]  UMBILICAL OR INGUINAL HERNIA REPAIR: POST OP INSTRUCTIONS  Always review your discharge instruction sheet given to you by the facility where your surgery was performed. IF YOU HAVE DISABILITY OR FAMILY LEAVE FORMS, YOU MUST BRING THEM TO THE OFFICE FOR PROCESSING.   DO NOT GIVE THEM TO YOUR DOCTOR.  1. A  prescription for pain medication may be given to you upon discharge.  Take your pain medication as prescribed, if needed.  If narcotic pain medicine is not needed, then you may take acetaminophen (Tylenol) or ibuprofen (Advil) as needed. 2. Take  your usually prescribed medications unless otherwise directed. If you need a refill on your pain medication, please contact your pharmacy.  They will contact our office to request authorization. Prescriptions will not be filled after 5 pm or on week-ends. 3. You should follow a light diet the first 24 hours after arrival home, such as soup and crackers, etc.  Be sure to include lots of fluids daily.  Resume your normal diet the day after surgery. 4.Most patients will experience some swelling and bruising around the umbilicus or in the groin and scrotum.  Ice packs and reclining will help.  Swelling and bruising can take several days to resolve.  6. It is common to experience some constipation if taking pain medication after surgery.  Increasing fluid intake and taking a stool softener (such as Colace) will usually help or prevent this problem from occurring.  A mild laxative (Milk of Magnesia or Miralax) should be taken according to package directions if there are no bowel movements after 48 hours. 7. Unless discharge instructions indicate otherwise, you may remove your bandages 24-48 hours after surgery, and you may shower at that time.  You may have steri-strips (small skin tapes) in place directly over the incision.  These strips should be left on the skin for 7-10 days.  If your surgeon used skin glue on the incision, you may shower in 24 hours.  The glue will flake off over the next 2-3 weeks.  Any sutures or staples will be removed at the office during your follow-up visit. 8. ACTIVITIES:  You may resume regular (light) daily activities beginning the next  day-such as daily self-care, walking, climbing stairs-gradually increasing activities as tolerated.  You may have sexual intercourse when it is comfortable.  Refrain from any heavy lifting or straining until approved by your doctor.  a.You may drive when you are no longer taking prescription pain medication, you can comfortably wear a seatbelt, and  you can safely maneuver your car and apply brakes. b.RETURN TO WORK:   _____________________________________________  9.You should see your doctor in the office for a follow-up appointment approximately 2-3 weeks after your surgery.  Make sure that you call for this appointment within a day or two after you arrive home to insure a convenient appointment time. 10.OTHER INSTRUCTIONS: _________________________    _____________________________________  WHEN TO CALL YOUR DOCTOR: Fever over 101.0 Inability to urinate Nausea and/or vomiting Extreme swelling or bruising Continued bleeding from incision. Increased pain, redness, or drainage from the incision  The clinic staff is available to answer your questions during regular business hours.  Please don't hesitate to call and ask to speak to one of the nurses for clinical concerns.  If you have a medical emergency, go to the nearest emergency room or call 911.  A surgeon from [facility] is always on call at the hospital   [address], [place], [place]  [address] ?  [address] [address], [place], [place]   [address] [phone] ? [phone] ? FAX [phone] Web site: [email or web address]   Discharge wound care:   Complete by: As directed    Wear abdominal binder when out of bed. Empty and record drain output.   Increase activity slowly   Complete by: As directed      Allergies as of 04/22/2020   No Known Allergies     Medication List    STOP taking these medications   amoxicillin-clavulanate 875-125 MG tablet Commonly known as: AUGMENTIN   ibuprofen 800 MG tablet Commonly known as: ADVIL     TAKE these medications   methocarbamol 500 MG tablet Commonly known as: ROBAXIN Take 1 tablet (500 mg total) by mouth every 6 (six) hours as needed for muscle spasms.   oxyCODONE 5 MG immediate release tablet Commonly known as: Oxy IR/ROXICODONE Take 1 tablet (5 mg total) by mouth every 6 (six)  hours as needed for severe pain.            Discharge Care Instructions  (From admission, onward)         Start     Ordered   04/22/20 0000  Discharge wound care:       Comments: Wear abdominal binder when out of bed. Empty and record drain output.   04/22/20 0801          Follow-up Information    Violeta Gelinas, MD Follow up.   Specialty: General Surgery Why: my office will call you tomorrow with an appointment time for 8/16 Contact information: [address] [place] [address] [place] [address] [phone]               Signed: Liz Malady 04/22/2020, 8:05 AM

## 2021-05-15 IMAGING — CT CT ABD-PELV W/ CM
2 of 5 series · 16 of 46 positions shown, 18 images · IV contrast (iopamidol)
Comparison: None.

CLINICAL DATA: Ventral wall hernia

EXAM:
CT ABDOMEN AND PELVIS WITH CONTRAST
TECHNIQUE: Multidetector CT imaging of the abdomen and pelvis was performed
using the standard protocol following bolus administration of
intravenous contrast.
CONTRAST:  125mL A7XHQ4-Z00 IOPAMIDOL (A7XHQ4-Z00) INJECTION 61%

[Series 2: abd pelvis 5.00 br40 s3 axial · axial · 0.94mm/px · z∈[+1269,+1669]mm · 13 of 90 slices shown, 15 images]
[im 5/90  soft-tissue]
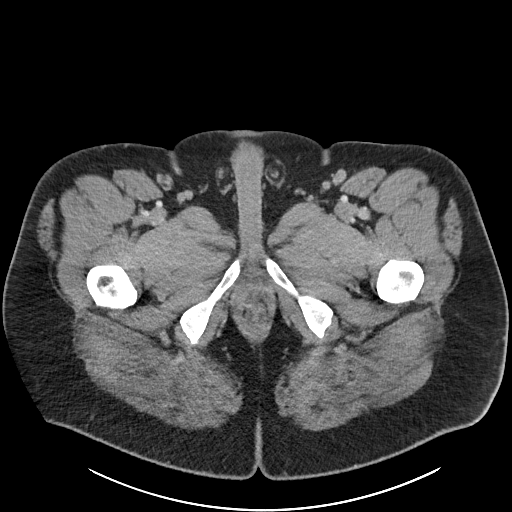
[im 5/90  bone]
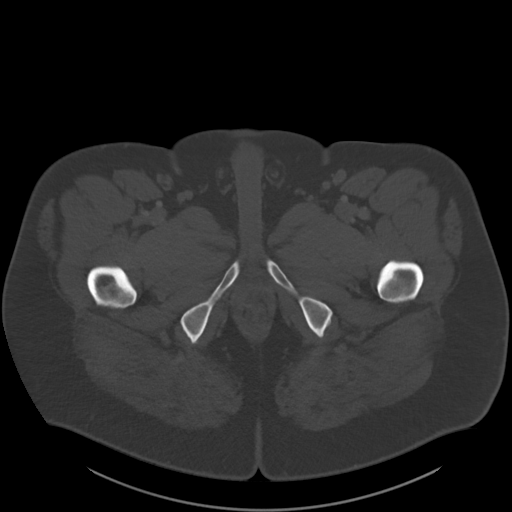
[im 15/90  soft-tissue]
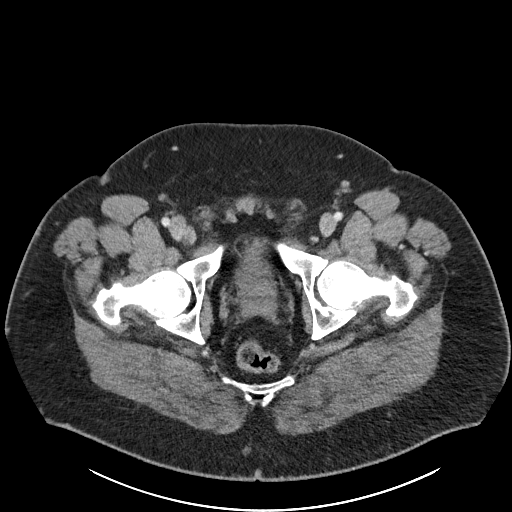
[im 19/90  soft-tissue]
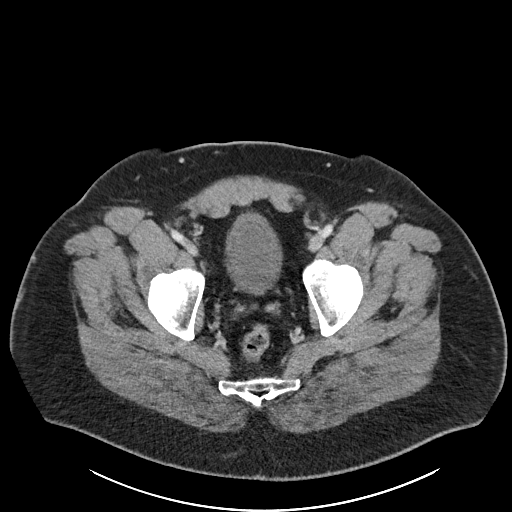
[im 24/90  soft-tissue]
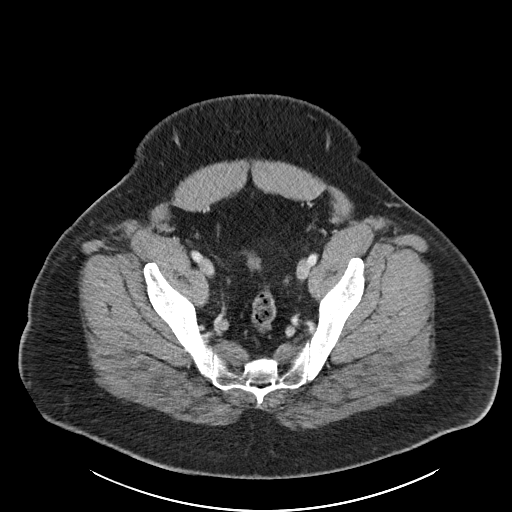
[im 33/90  soft-tissue]
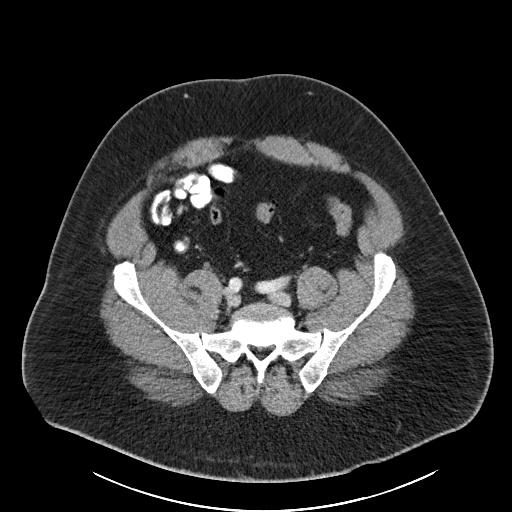
[im 38/90  soft-tissue]
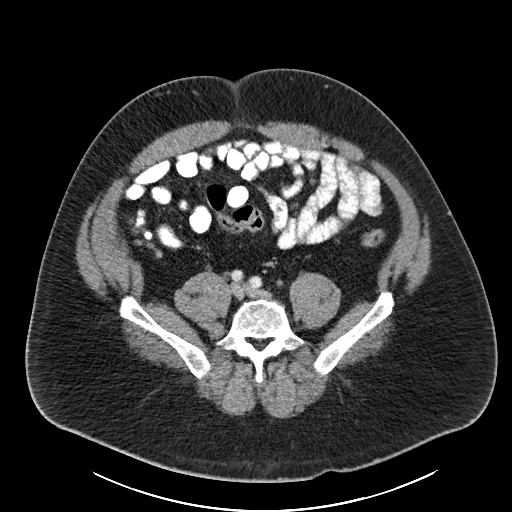
[im 47/90  soft-tissue]
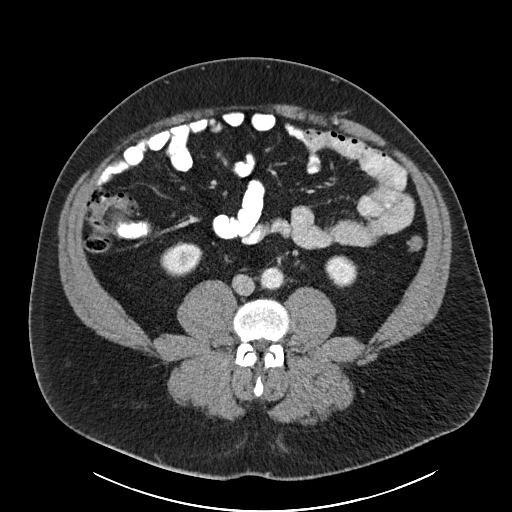
[im 52/90  soft-tissue]
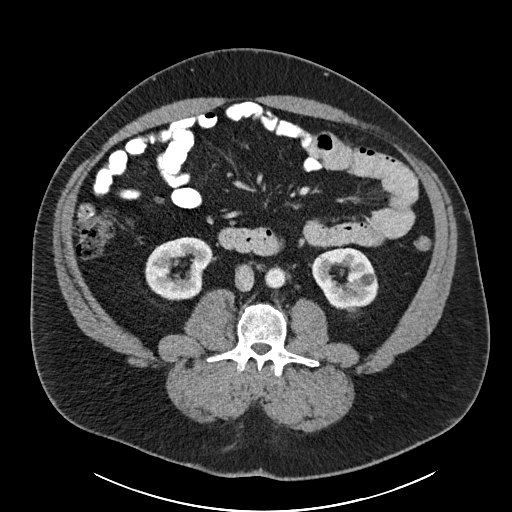
[im 57/90  soft-tissue]
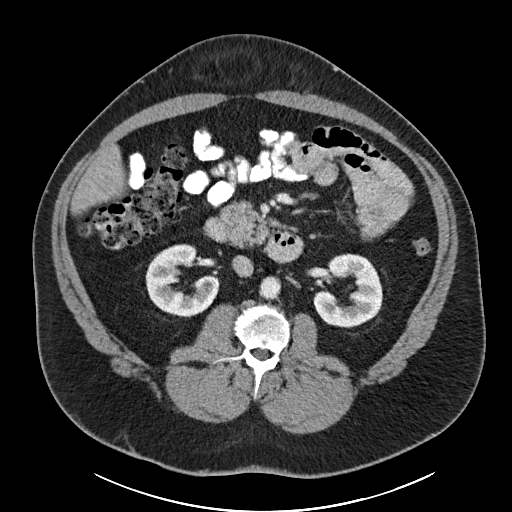
[im 57/90  bone]
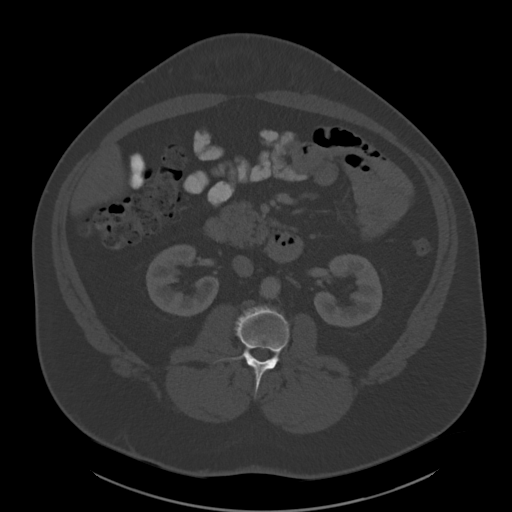
[im 66/90  soft-tissue]
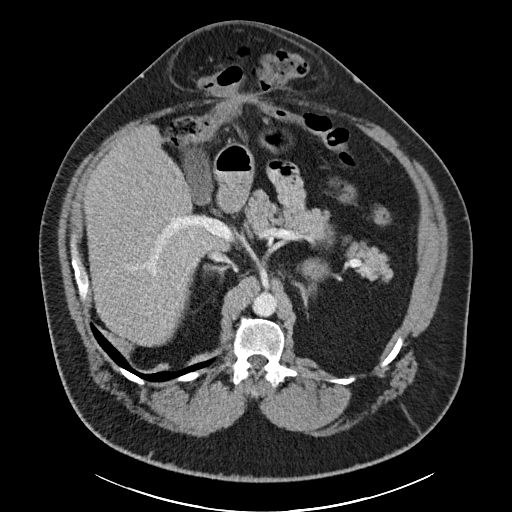
[im 71/90  soft-tissue]
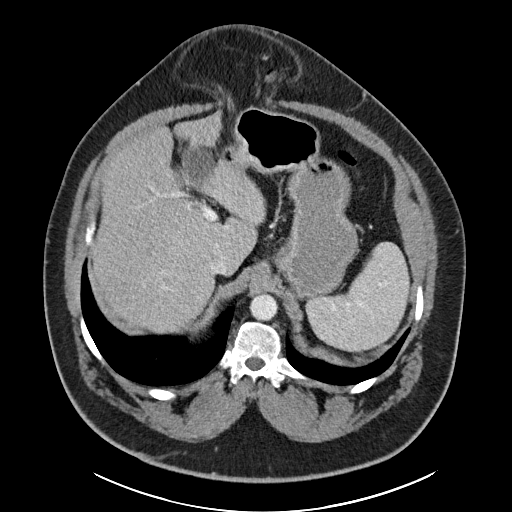
[im 75/90  soft-tissue]
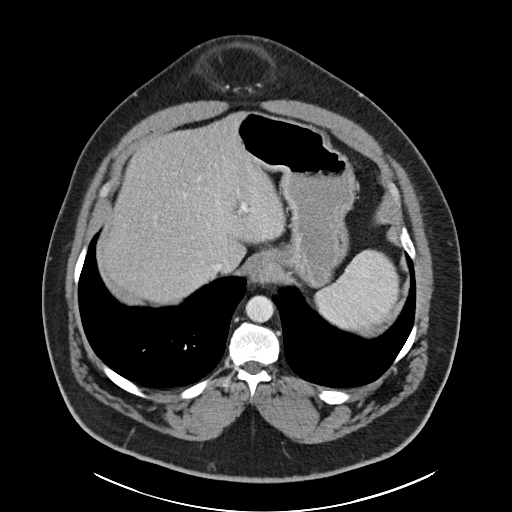
[im 85/90  soft-tissue]
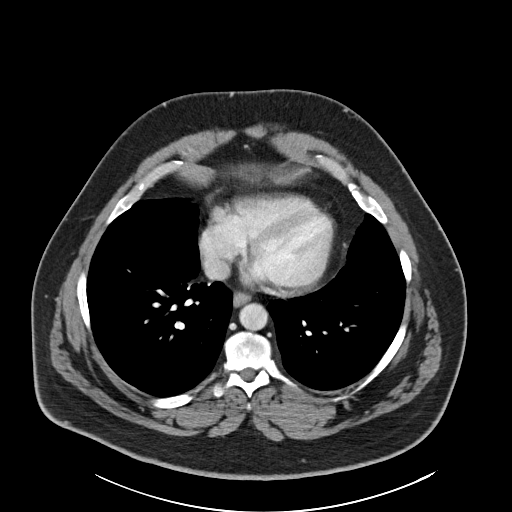

[Series 6: abd pelvis 2.00 br40 s3 cor · coronal · 0.88mm/px · 3 of 239 slices shown]
[im 80/239  soft-tissue]
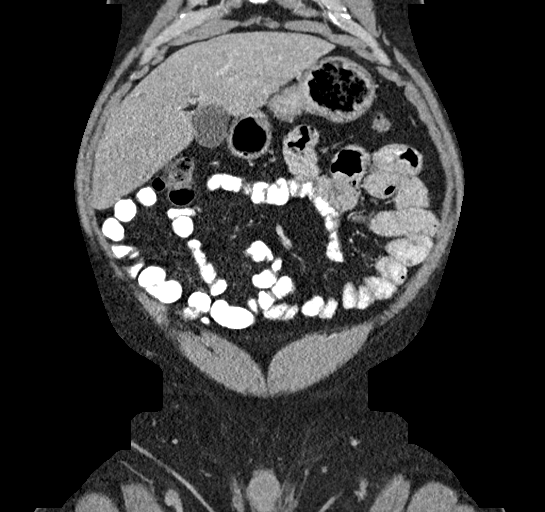
[im 106/239  soft-tissue]
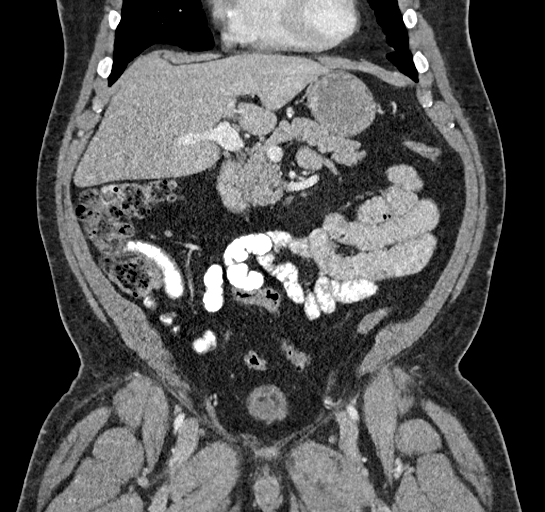
[im 133/239  soft-tissue]
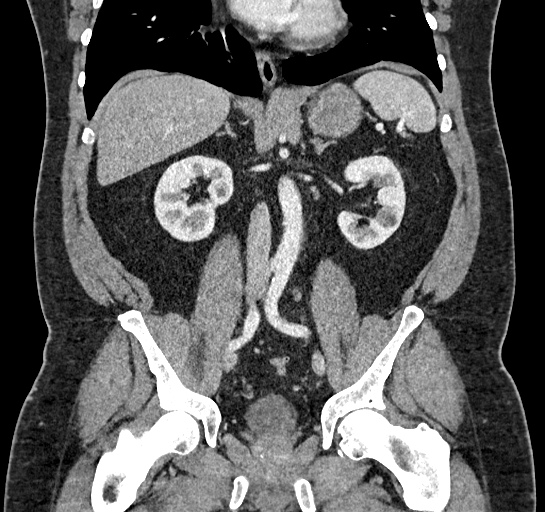

[16 of 46 positions shown; findings below may reference images not displayed]

FINDINGS: Lower chest: The lung bases are clear. The heart size is normal.

Hepatobiliary: The liver is normal. Normal gallbladder.There is no
biliary ductal dilation.

Pancreas: Normal contours without ductal dilatation. No
peripancreatic fluid collection.

Spleen: Unremarkable.

Adrenals/Urinary Tract:

--Adrenal glands: Unremarkable.

--Right kidney/ureter: No hydronephrosis or radiopaque kidney
stones.

--Left kidney/ureter: No hydronephrosis or radiopaque kidney stones.

--Urinary bladder: Bladder is underdistended which limits
evaluation. However, there does appear to be some diffuse
circumferential wall thickening of the urinary bladder.

Stomach/Bowel:

--Stomach/Duodenum: No hiatal hernia or other gastric abnormality.
Normal duodenal course and caliber.

--Small bowel: Unremarkable.

--Colon: Unremarkable.

--Appendix: Normal.

Vascular/Lymphatic: Atherosclerotic calcification is present within
the non-aneurysmal abdominal aorta, without hemodynamically
significant stenosis.

--No retroperitoneal lymphadenopathy.

--No mesenteric lymphadenopathy.

--No pelvic or inguinal lymphadenopathy.

Reproductive: Unremarkable

Other: No ascites or free air. There is a large ventral wall hernia
superior to the umbilicus. The abdominal wall defect measures
approximately 5 cm in width. The hernia sac contains a loop of the
transverse colon without evidence for obstruction in addition to
omental fat. There is a tiny fat containing umbilical hernia.

Musculoskeletal. No acute displaced fractures.
IMPRESSION: 1. Large ventral wall hernia superior to the umbilicus containing a
loop of the transverse colon without evidence for obstruction.
2. Bladder is underdistended which limits evaluation. However, there
does appear to be some diffuse circumferential wall thickening of
the urinary bladder. Correlation with urinalysis is recommended.

Aortic Atherosclerosis ([id number]-TRR.R).
